# Patient Record
Sex: Male | Born: 1980 | Race: White | Hispanic: No | Marital: Single | Smoking: Never smoker
Health system: Southern US, Community
[De-identification: ages and names within clinical notes are randomized; demographics above are authoritative.]

## PROBLEM LIST (undated history)

## (undated) DIAGNOSIS — I10 Essential (primary) hypertension: Secondary | ICD-10-CM

## (undated) HISTORY — DX: Essential (primary) hypertension: I10

---
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
## 2002-03-22 ENCOUNTER — Emergency Department (HOSPITAL_COMMUNITY): Admission: EM | Admit: 2002-03-22 | Discharge: 2002-03-22 | Payer: Self-pay | Admitting: Emergency Medicine

## 2002-03-22 ENCOUNTER — Encounter: Payer: Self-pay | Admitting: Emergency Medicine

## 2005-09-16 ENCOUNTER — Ambulatory Visit (HOSPITAL_COMMUNITY): Admission: RE | Admit: 2005-09-16 | Discharge: 2005-09-16 | Payer: Self-pay | Admitting: Internal Medicine

## 2005-10-06 ENCOUNTER — Ambulatory Visit (HOSPITAL_COMMUNITY): Admission: RE | Admit: 2005-10-06 | Discharge: 2005-10-06 | Payer: Self-pay | Admitting: Orthopedic Surgery

## 2006-12-08 IMAGING — CR DG CHEST W/ LORDOTIC
3 series · 3 of 3 positions shown · non-contrast
Comparison: none

CLINICAL DATA: Right upper arm and axilla pain.  Some numbness as well.
 CHEST WITH LORDOTIC ? 3 VIEWS:
 PA and lateral chest with apical lordotic view dated 10/06/05:

[view not recorded (1 of 3)]
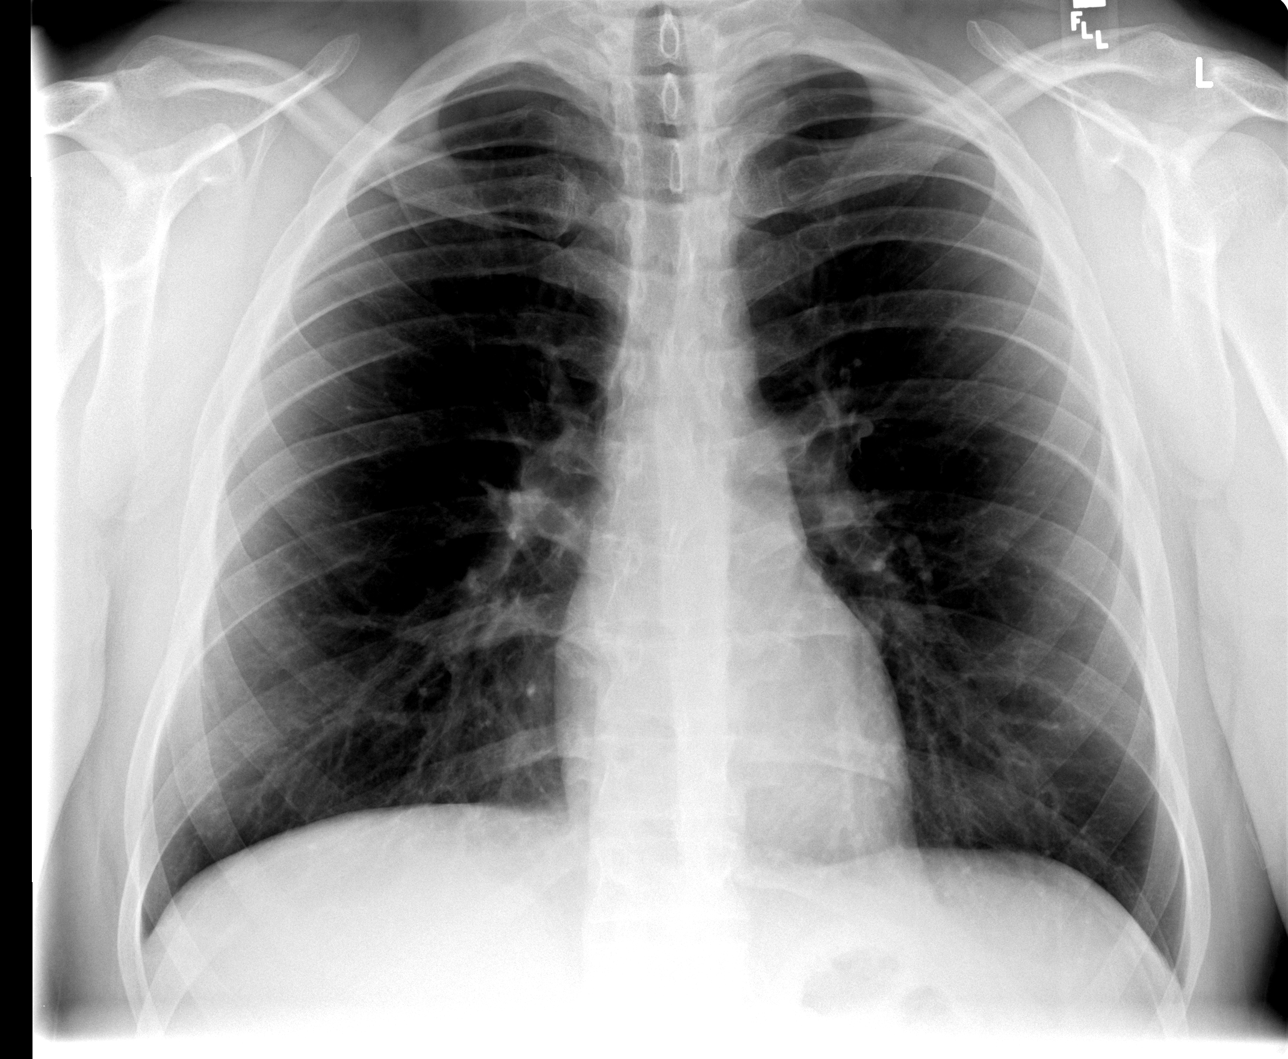

[view not recorded (2 of 3)]
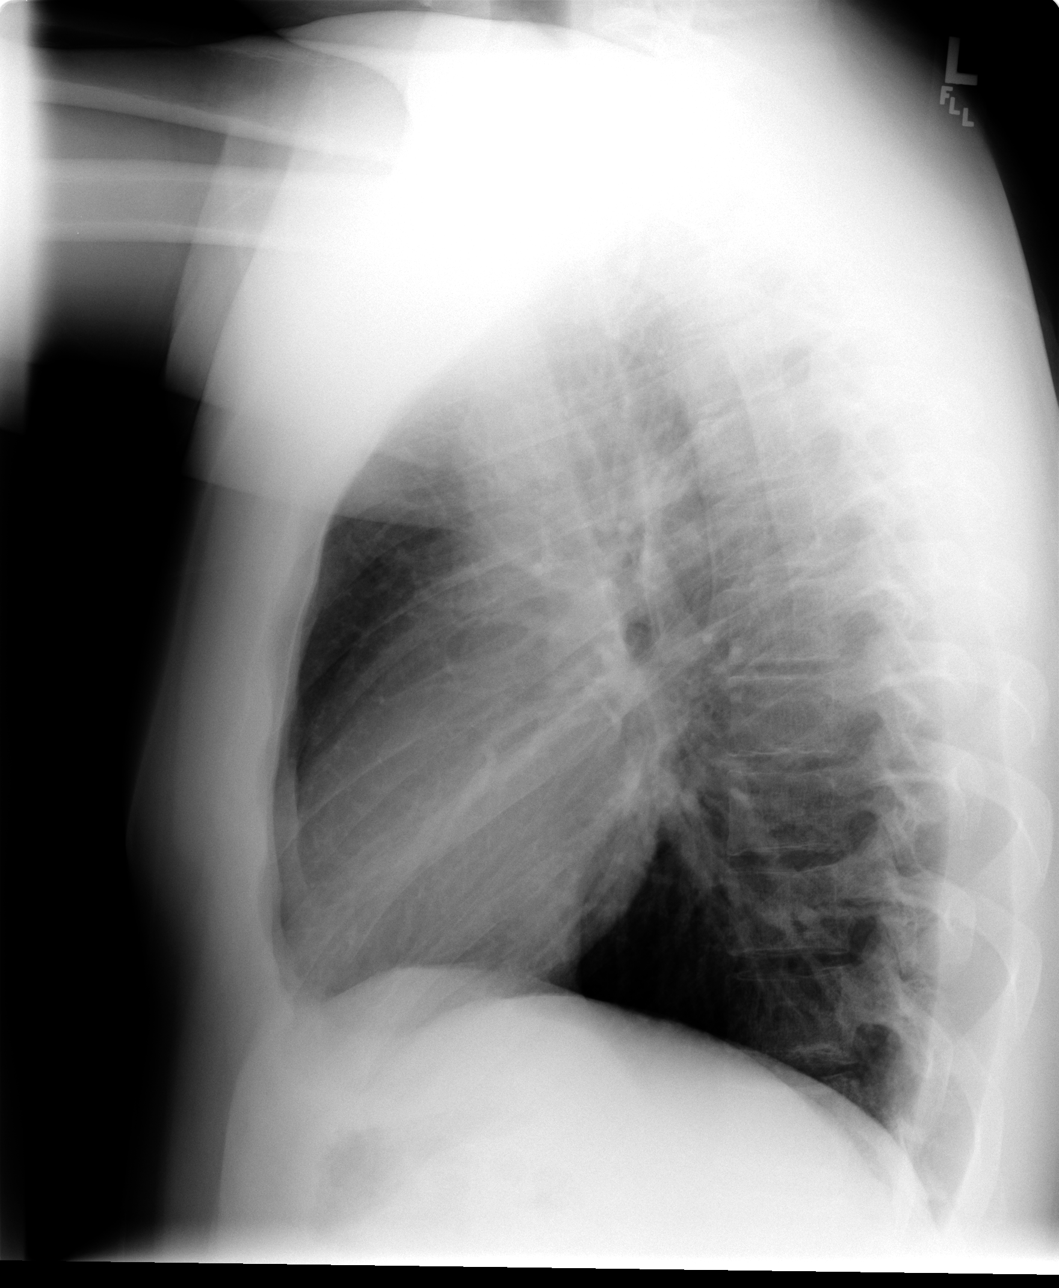

[view not recorded (3 of 3)]
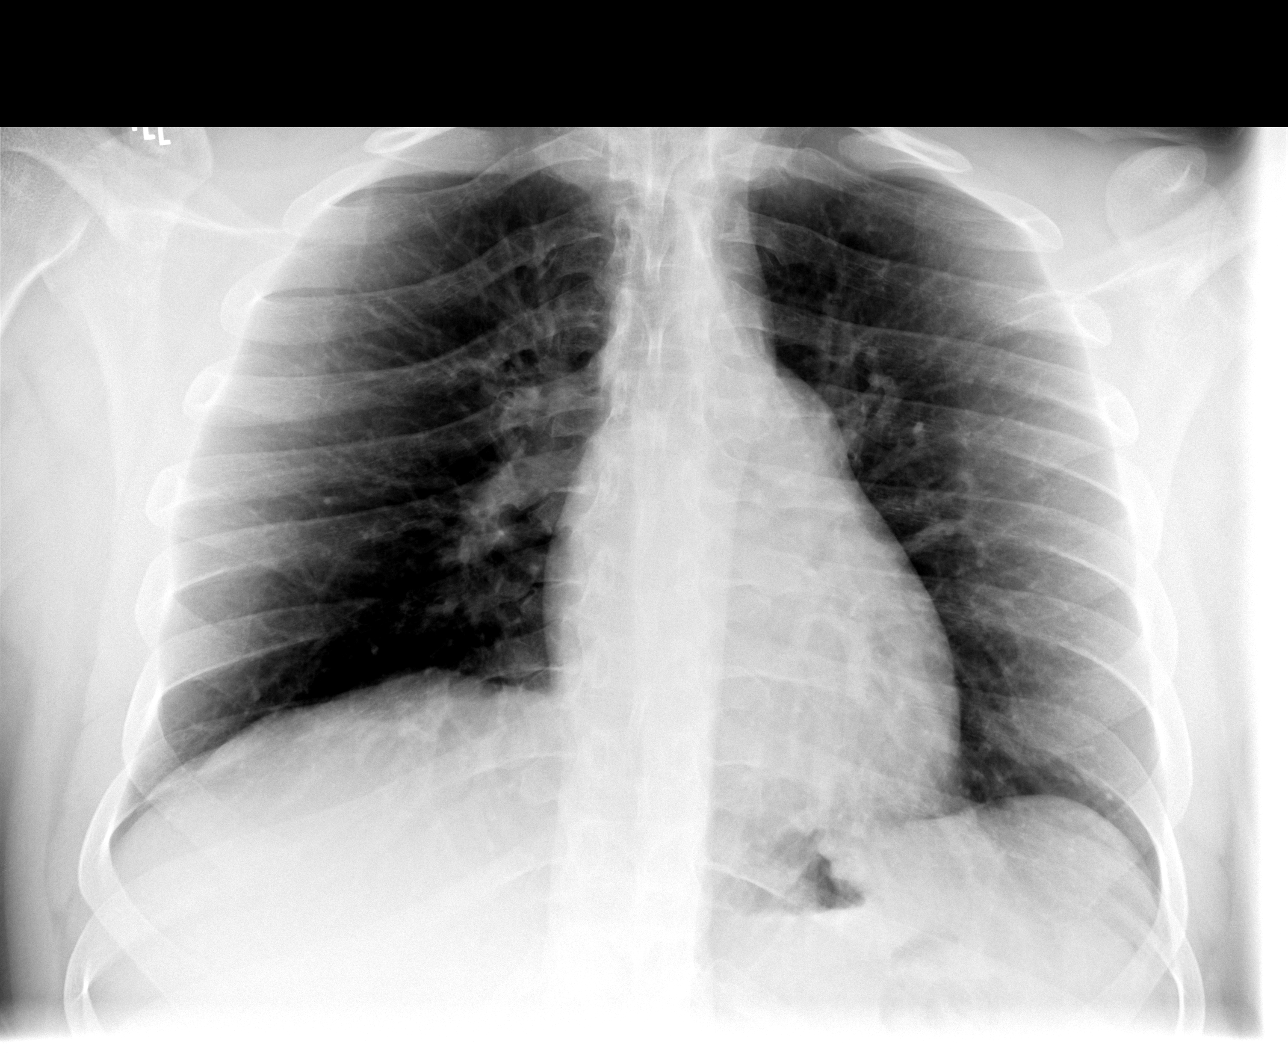

[3 of 3 positions shown; findings below may reference images not displayed]

FINDINGS: The heart size and vascularity are normal.  There is no evidence of an apical lung mass.  However, the patient does appear to have tiny cervical ribs bilaterally at C7 appearing to being slightly longer on the right than the left.  Could the cervical rib be responsible for the patient?s symptoms?
 No other abnormality.
IMPRESSION: Small bilateral cervical ribs, appearing to be slightly more prominent on the PA view on the right than the left.  
 Otherwise normal exam.

## 2008-08-01 ENCOUNTER — Emergency Department (HOSPITAL_COMMUNITY): Admission: EM | Admit: 2008-08-01 | Discharge: 2008-08-01 | Payer: Self-pay | Admitting: Emergency Medicine

## 2010-10-01 LAB — POCT CARDIAC MARKERS
CKMB, poc: <1 ng/mL — ABNORMAL LOW (ref 1.0–8.0)
Myoglobin, poc: 59.1 ng/mL (ref 12–200)
Troponin i, poc: <0.05 ng/mL (ref 0.00–0.09)

## 2012-10-24 ENCOUNTER — Emergency Department (HOSPITAL_COMMUNITY)
Admission: EM | Admit: 2012-10-24 | Discharge: 2012-10-24 | Disposition: A | Discharge status: Home / Self Care | Payer: BC Managed Care – PPO | Attending: Emergency Medicine | Admitting: Emergency Medicine

## 2012-10-24 ENCOUNTER — Encounter (HOSPITAL_COMMUNITY): Payer: Self-pay | Admitting: Emergency Medicine

## 2012-10-24 ENCOUNTER — Emergency Department (HOSPITAL_COMMUNITY): Payer: BC Managed Care – PPO

## 2012-10-24 DIAGNOSIS — T25129A Burn of first degree of unspecified foot, initial encounter: Secondary | ICD-10-CM | POA: Insufficient documentation

## 2012-10-24 DIAGNOSIS — X12XXXA Contact with other hot fluids, initial encounter: Secondary | ICD-10-CM | POA: Insufficient documentation

## 2012-10-24 DIAGNOSIS — T300 Burn of unspecified body region, unspecified degree: Secondary | ICD-10-CM

## 2012-10-24 DIAGNOSIS — Y929 Unspecified place or not applicable: Secondary | ICD-10-CM | POA: Insufficient documentation

## 2012-10-24 DIAGNOSIS — Z23 Encounter for immunization: Secondary | ICD-10-CM | POA: Insufficient documentation

## 2012-10-24 DIAGNOSIS — Y9389 Activity, other specified: Secondary | ICD-10-CM | POA: Insufficient documentation

## 2012-10-24 DIAGNOSIS — X131XXA Other contact with steam and other hot vapors, initial encounter: Secondary | ICD-10-CM | POA: Insufficient documentation

## 2012-10-24 MED ORDER — SULFAMETHOXAZOLE-TRIMETHOPRIM 800-160 MG PO TABS: 1.0000 | ORAL_TABLET | Freq: Two times a day (BID) | ORAL | Status: AC

## 2012-10-24 MED ORDER — SULFAMETHOXAZOLE-TRIMETHOPRIM 800-160 MG PO TABS: 1.0000 | ORAL_TABLET | Freq: Two times a day (BID) | ORAL | Status: DC

## 2012-10-24 MED ORDER — IBUPROFEN 800 MG PO TABS
800.0000 mg | ORAL_TABLET | Freq: Once | ORAL | Status: AC
  Administered 2012-10-24: 800 mg via ORAL
  Filled 2012-10-24: qty 1

## 2012-10-24 MED ORDER — SULFAMETHOXAZOLE-TMP DS 800-160 MG PO TABS
1.0000 | ORAL_TABLET | Freq: Once | ORAL | Status: AC
  Administered 2012-10-24: 1 via ORAL
  Filled 2012-10-24: qty 1

## 2012-10-24 MED ORDER — HYDROCODONE-ACETAMINOPHEN 5-325 MG PO TABS: 1.0000 | ORAL_TABLET | ORAL | Status: DC | PRN

## 2012-10-24 MED ORDER — TETANUS-DIPHTH-ACELL PERTUSSIS 5-2.5-18.5 LF-MCG/0.5 IM SUSP
0.5000 mL | Freq: Once | INTRAMUSCULAR | Status: AC
  Administered 2012-10-24: 0.5 mL via INTRAMUSCULAR
  Filled 2012-10-24: qty 0.5

## 2012-10-24 MED ORDER — BACITRACIN-NEOMYCIN-POLYMYXIN 400-5-5000 EX OINT
TOPICAL_OINTMENT | Freq: Once | CUTANEOUS | Status: AC
  Administered 2012-10-24: 1 via TOPICAL
  Filled 2012-10-24: qty 1
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
## 2012-10-24 NOTE — ED Notes (Signed)
Applied bacitracin to wound and covered with sterile dressing. Patient tolerated well.

## 2012-10-25 NOTE — ED Provider Notes (Signed)
Medical screening examination/treatment/procedure(s) were performed by non-physician practitioner and as supervising physician I was immediately available for consultation/collaboration.   Carleene Cooper III, MD 10/25/12 [phone]
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
## 2016-04-23 DIAGNOSIS — Z23 Encounter for immunization: Secondary | ICD-10-CM | POA: Diagnosis not present

## 2016-04-23 DIAGNOSIS — K1370 Unspecified lesions of oral mucosa: Secondary | ICD-10-CM | POA: Diagnosis not present

## 2016-05-01 DIAGNOSIS — E785 Hyperlipidemia, unspecified: Secondary | ICD-10-CM | POA: Diagnosis not present

## 2016-05-05 ENCOUNTER — Ambulatory Visit (INDEPENDENT_AMBULATORY_CARE_PROVIDER_SITE_OTHER): Payer: BLUE CROSS/BLUE SHIELD | Admitting: Otolaryngology

## 2016-05-05 DIAGNOSIS — D3709 Neoplasm of uncertain behavior of other specified sites of the oral cavity: Secondary | ICD-10-CM | POA: Diagnosis not present

## 2016-07-07 ENCOUNTER — Ambulatory Visit (INDEPENDENT_AMBULATORY_CARE_PROVIDER_SITE_OTHER): Payer: BLUE CROSS/BLUE SHIELD | Admitting: Otolaryngology

## 2016-07-07 DIAGNOSIS — D3705 Neoplasm of uncertain behavior of pharynx: Secondary | ICD-10-CM | POA: Diagnosis not present

## 2017-01-05 ENCOUNTER — Ambulatory Visit (INDEPENDENT_AMBULATORY_CARE_PROVIDER_SITE_OTHER): Payer: BLUE CROSS/BLUE SHIELD | Admitting: Otolaryngology

## 2017-01-05 DIAGNOSIS — D3702 Neoplasm of uncertain behavior of tongue: Secondary | ICD-10-CM

## 2017-07-23 DIAGNOSIS — M25561 Pain in right knee: Secondary | ICD-10-CM | POA: Diagnosis not present

## 2017-08-12 DIAGNOSIS — Z0000 Encounter for general adult medical examination without abnormal findings: Secondary | ICD-10-CM | POA: Diagnosis not present

## 2017-08-14 DIAGNOSIS — Z0000 Encounter for general adult medical examination without abnormal findings: Secondary | ICD-10-CM | POA: Diagnosis not present

## 2018-05-12 DIAGNOSIS — R0602 Shortness of breath: Secondary | ICD-10-CM | POA: Diagnosis not present

## 2018-05-12 DIAGNOSIS — I10 Essential (primary) hypertension: Secondary | ICD-10-CM | POA: Diagnosis not present

## 2018-05-26 DIAGNOSIS — I10 Essential (primary) hypertension: Secondary | ICD-10-CM | POA: Diagnosis not present

## 2018-05-26 DIAGNOSIS — R0602 Shortness of breath: Secondary | ICD-10-CM | POA: Diagnosis not present

## 2018-12-03 DIAGNOSIS — T63441A Toxic effect of venom of bees, accidental (unintentional), initial encounter: Secondary | ICD-10-CM | POA: Diagnosis not present

## 2018-12-03 DIAGNOSIS — T7840XA Allergy, unspecified, initial encounter: Secondary | ICD-10-CM | POA: Diagnosis not present

## 2018-12-08 DIAGNOSIS — T63441A Toxic effect of venom of bees, accidental (unintentional), initial encounter: Secondary | ICD-10-CM | POA: Diagnosis not present

## 2019-01-04 DIAGNOSIS — T63441D Toxic effect of venom of bees, accidental (unintentional), subsequent encounter: Secondary | ICD-10-CM | POA: Diagnosis not present

## 2019-01-12 DIAGNOSIS — T63441D Toxic effect of venom of bees, accidental (unintentional), subsequent encounter: Secondary | ICD-10-CM | POA: Diagnosis not present

## 2019-01-20 DIAGNOSIS — T63441D Toxic effect of venom of bees, accidental (unintentional), subsequent encounter: Secondary | ICD-10-CM | POA: Diagnosis not present

## 2019-01-26 DIAGNOSIS — T63441D Toxic effect of venom of bees, accidental (unintentional), subsequent encounter: Secondary | ICD-10-CM | POA: Diagnosis not present

## 2019-02-04 DIAGNOSIS — T63441D Toxic effect of venom of bees, accidental (unintentional), subsequent encounter: Secondary | ICD-10-CM | POA: Diagnosis not present

## 2019-02-11 DIAGNOSIS — T63441D Toxic effect of venom of bees, accidental (unintentional), subsequent encounter: Secondary | ICD-10-CM | POA: Diagnosis not present

## 2019-02-18 DIAGNOSIS — T63441D Toxic effect of venom of bees, accidental (unintentional), subsequent encounter: Secondary | ICD-10-CM | POA: Diagnosis not present

## 2019-02-25 DIAGNOSIS — T63441D Toxic effect of venom of bees, accidental (unintentional), subsequent encounter: Secondary | ICD-10-CM | POA: Diagnosis not present

## 2019-03-04 DIAGNOSIS — T63441D Toxic effect of venom of bees, accidental (unintentional), subsequent encounter: Secondary | ICD-10-CM | POA: Diagnosis not present

## 2019-03-11 DIAGNOSIS — Z91030 Bee allergy status: Secondary | ICD-10-CM | POA: Diagnosis not present

## 2019-03-18 DIAGNOSIS — Z91030 Bee allergy status: Secondary | ICD-10-CM | POA: Diagnosis not present

## 2019-03-25 DIAGNOSIS — Z91030 Bee allergy status: Secondary | ICD-10-CM | POA: Diagnosis not present

## 2019-04-01 DIAGNOSIS — Z91030 Bee allergy status: Secondary | ICD-10-CM | POA: Diagnosis not present

## 2019-04-08 DIAGNOSIS — Z91030 Bee allergy status: Secondary | ICD-10-CM | POA: Diagnosis not present

## 2019-04-15 DIAGNOSIS — Z91030 Bee allergy status: Secondary | ICD-10-CM | POA: Diagnosis not present

## 2019-04-22 DIAGNOSIS — Z91030 Bee allergy status: Secondary | ICD-10-CM | POA: Diagnosis not present

## 2019-05-06 DIAGNOSIS — Z91030 Bee allergy status: Secondary | ICD-10-CM | POA: Diagnosis not present

## 2019-05-11 DIAGNOSIS — Z91030 Bee allergy status: Secondary | ICD-10-CM | POA: Diagnosis not present

## 2019-05-20 DIAGNOSIS — Z91030 Bee allergy status: Secondary | ICD-10-CM | POA: Diagnosis not present

## 2019-05-27 DIAGNOSIS — Z91030 Bee allergy status: Secondary | ICD-10-CM | POA: Diagnosis not present

## 2019-06-03 DIAGNOSIS — Z91030 Bee allergy status: Secondary | ICD-10-CM | POA: Diagnosis not present

## 2019-06-14 DIAGNOSIS — Z91030 Bee allergy status: Secondary | ICD-10-CM | POA: Diagnosis not present

## 2019-08-24 DIAGNOSIS — M545 Low back pain: Secondary | ICD-10-CM | POA: Diagnosis not present

## 2019-11-25 DIAGNOSIS — R52 Pain, unspecified: Secondary | ICD-10-CM | POA: Diagnosis not present

## 2019-11-25 DIAGNOSIS — W57XXXA Bitten or stung by nonvenomous insect and other nonvenomous arthropods, initial encounter: Secondary | ICD-10-CM | POA: Diagnosis not present

## 2020-07-27 DIAGNOSIS — Z23 Encounter for immunization: Secondary | ICD-10-CM | POA: Diagnosis not present

## 2020-07-27 DIAGNOSIS — W540XXA Bitten by dog, initial encounter: Secondary | ICD-10-CM | POA: Diagnosis not present

## 2020-07-27 DIAGNOSIS — S41151A Open bite of right upper arm, initial encounter: Secondary | ICD-10-CM | POA: Diagnosis not present

## 2020-07-27 DIAGNOSIS — L03113 Cellulitis of right upper limb: Secondary | ICD-10-CM | POA: Diagnosis not present

## 2020-09-13 DIAGNOSIS — L570 Actinic keratosis: Secondary | ICD-10-CM | POA: Diagnosis not present

## 2020-09-13 DIAGNOSIS — L578 Other skin changes due to chronic exposure to nonionizing radiation: Secondary | ICD-10-CM | POA: Diagnosis not present

## 2021-09-19 ENCOUNTER — Ambulatory Visit: Payer: BC Managed Care – PPO | Admitting: Cardiology

## 2021-09-19 ENCOUNTER — Other Ambulatory Visit (HOSPITAL_COMMUNITY)
Admission: RE | Admit: 2021-09-19 | Discharge: 2021-09-19 | Disposition: A | Discharge status: Home / Self Care | Payer: BC Managed Care – PPO | Source: Ambulatory Visit | Attending: Cardiology | Admitting: Cardiology

## 2021-09-19 ENCOUNTER — Encounter: Payer: Self-pay | Admitting: Cardiology

## 2021-09-19 VITALS — BP 120/74 | HR 95 | Ht 74.0 in | Wt 236.0 lb

## 2021-09-19 DIAGNOSIS — R42 Dizziness and giddiness: Secondary | ICD-10-CM | POA: Diagnosis not present

## 2021-09-19 DIAGNOSIS — I10 Essential (primary) hypertension: Secondary | ICD-10-CM | POA: Insufficient documentation

## 2021-09-19 DIAGNOSIS — R072 Precordial pain: Secondary | ICD-10-CM | POA: Diagnosis not present

## 2021-09-19 DIAGNOSIS — R079 Chest pain, unspecified: Secondary | ICD-10-CM

## 2021-09-19 DIAGNOSIS — H42 Glaucoma in diseases classified elsewhere: Secondary | ICD-10-CM | POA: Diagnosis present

## 2021-09-19 LAB — BASIC METABOLIC PANEL
Anion gap: 8 (ref 5–15)
BUN: 17 mg/dL (ref 6–20)
CO2: 28 mmol/L (ref 22–32)
Calcium: 9.2 mg/dL (ref 8.9–10.3)
Chloride: 105 mmol/L (ref 98–111)
Creatinine, Ser: 0.95 mg/dL (ref 0.61–1.24)
GFR, Estimated: >60 mL/min (ref >60)
Glucose, Bld: 104 mg/dL — ABNORMAL HIGH (ref 70–99)
Potassium: 3.8 mmol/L (ref 3.5–5.1)
Sodium: 141 mmol/L (ref 135–145)

## 2021-09-19 MED ORDER — METOPROLOL TARTRATE 100 MG PO TABS: 100.0000 mg | ORAL_TABLET | Freq: Once | ORAL | 0 refills | Status: DC

## 2021-09-19 NOTE — Addendum Note (Signed)
Addended by: Antonieta Iba on: 09/19/2021 09:36 AM ? ? Modules accepted: Orders ? ?

## 2021-09-19 NOTE — Addendum Note (Signed)
Addended by: Antonieta Iba on: 09/19/2021 10:57 AM ? ? Modules accepted: Orders ? ?

## 2021-09-19 NOTE — Patient Instructions (Addendum)
Medication Instructions:  ?Your physician recommends that you continue on your current medications as directed. Please refer to the Current Medication list given to you today. ? ?*If you need a refill on your cardiac medications before your next appointment, please call your pharmacy* ? ? ?Lab Work: ?TODAY: BMET ?If you have labs (blood work) drawn today and your tests are completely normal, you will receive your results only by: ?MyChart Message (if you have MyChart) OR ?A paper copy in the mail ?If you have any lab test that is abnormal or we need to change your treatment, we will call you to review the results. ? ? ?Testing/Procedures: ?Your physician has requested that you have an echocardiogram. Echocardiography is a painless test that uses sound waves to create images of your heart. It provides your doctor with information about the size and shape of your heart and how well your heart?s chambers and valves are working. This procedure takes approximately one hour. There are no restrictions for this procedure. ? ?Your physician has requested that you have coronary CTA scan. Please see below for further instructions.  ? ?Follow-Up: ?At [facility], you and your health needs are our priority.  As part of our continuing mission to provide you with exceptional heart care, we have created designated Provider Care Teams.  These Care Teams include your primary Cardiologist (physician) and Advanced Practice Providers (APPs -  Physician Assistants and Nurse Practitioners) who all work together to provide you with the care you need, when you need it. ? ?Follow up with Dr. Radford Pax as needed based on results of testing.  ? ? ?Other Instructions ? ? ?Your cardiac CT will be scheduled at one of the below locations:  ? ?[facility] ?[address] ?[place], [place] [address] ?[phone] ? ?If scheduled at [facility], please arrive at the [facility] and Children's Entrance (Entrance C2) of [facility] 30 minutes prior to test start time. ?You can use the FREE valet parking offered at entrance C (encouraged to control the heart rate for the test)  ?Proceed to the [facility] Radiology Department (first floor) to check-in and test prep. ? ?All radiology patients and guests should use entrance C2 at [facility], accessed from [facility], even though the hospital's physical address listed is [address]. ? ? ? ? ?Please follow these instructions carefully (unless otherwise directed): ? ?Hold all erectile dysfunction medications at least 3 days (72 hrs) prior to test. ? ?On the Night Before the Test: ?Be sure to Drink plenty of water. ?Do not consume any caffeinated/decaffeinated beverages or chocolate 12 hours prior to your test. ?Do not take any antihistamines 12 hours prior to your test. ? ?On the Day of the Test: ?Drink plenty of water until 1 hour prior to the test. ?Do not eat any food 4 hours prior to the test. ?You may take your regular medications prior to the test.  ?Take metoprolol (Lopressor) two hours prior to test. ?HOLD Hydrochlorothiazide morning of the test. ? ?After the Test: ?Drink plenty of water. ?After receiving IV contrast, you may experience a mild flushed feeling. This is normal. ?On occasion, you may experience a mild rash up to 24 hours after the test. This is not dangerous. If this occurs, you can take Benadryl 25 mg and increase your fluid intake. ?If you experience trouble breathing, this can be serious. If it is severe call 911 IMMEDIATELY. If it is mild, please call our  office. ?If you take any of these medications: Glipizide/Metformin, Avandament, Glucavance, please do not take 48 hours after completing test unless otherwise instructed. ? ?We will call to schedule your test 2-4 weeks out understanding that some insurance companies will need an authorization prior to the service being performed.  ? ?For non-scheduling related questions, please  contact the cardiac imaging nurse navigator should you have any questions/concerns: ?Marchia Bond, Cardiac Imaging Nurse Navigator ?Gordy Clement, Cardiac Imaging Nurse Navigator ?[facility] ?Direct Office Dial: [phone]  ? ?For scheduling needs, including cancellations and rescheduling, please call [place], [phone].   ?

## 2021-09-19 NOTE — Progress Notes (Signed)
? ?Cardiology CONSULT Note   ? ?Date:  09/19/2021  ? ?ID:  Raymond James, DOB [birth year], MRN [id number] ? ?PCP:  Asencion Noble, MD  ?Cardiologist:  Fransico Him, MD  ? ?Chief Complaint  ?Patient presents with  ? New Patient (Initial Visit)  ?  Chest pain, HTN, dizziness  ? ? ?History of Present Illness:  ?Raymond James is a 41 y.o. male who is being seen today for the evaluation of chest pain at the request of Pablo Lawrence, NP. ? ?This is a 41yo male with no prior medical hx who recently was [facility] the [facility] for a filling.  He had it numbed and the next day he woke up and was very dizzy and felt very off balance with the room. He has a hard time describing it.  He tells me that also when he is on the ground playing with his kids and then stands up he gets very swimmy headed and feels like he is going to pass out.   ? ?He has had some problems with his BP being high and his PCP started Losartan. He then started having swimmy headedness especially with changes in position.  He had a URI 6 weeks ago and his symptoms got worse.  He has had an ear ache after the URI.  He was dx with external and internal ear infection and has an appt with ENT.He thinks the dizziness has gotten better after starting Bp med.   ? ?He tells me that he has been having episodes of chest pressure recently with no radiation.  There are no associated sx of nausea, diaphoresis or SOB.  It is nonexertional.  He says that it makes him feel anxious.  He has a lot of problems with stress right now and his wife encouraged him to see Cardiology.  He has a fm hx of HTN but no fm hx of CAD.  He has never smoked. ? ?Past Medical History:  ?Diagnosis Date  ? Benign essential HTN   ? ? ?No past surgical history on file. ? ?Current Medications: ?Current Meds  ?Medication Sig  ? losartan-hydrochlorothiazide (HYZAAR) 50-12.5 MG tablet Take 1 tablet by mouth daily.  ? ? ?Allergies:   Patient has no known allergies.  ? ?Social History   ? ?Socioeconomic History  ? Marital status: Married  ?  Spouse name: Not on file  ? Number of children: Not on file  ? Years of education: Not on file  ? Highest education level: Not on file  ?Occupational History  ? Not on file  ?Tobacco Use  ? Smoking status: Never  ? Smokeless tobacco: Current  ?  Types: Snuff  ?Substance and Sexual Activity  ? Alcohol use: No  ? Drug use: No  ? Sexual activity: Not on file  ?Other Topics Concern  ? Not on file  ?Social History Narrative  ? ** Merged History Encounter **  ?    ? ?Social Determinants of Health  ? ?Financial Resource Strain: Not on file  ?Food Insecurity: Not on file  ?Transportation Needs: Not on file  ?Physical Activity: Not on file  ?Stress: Not on file  ?Social Connections: Not on file  ?  ? ?Family History:  The patient's family history includes Diabetes in his father; Hyperlipidemia in his father and mother.  ? ?ROS:   ?Please see the history of present illness.    ?ROS All other systems reviewed and are negative. ? ?   ? View :  No data to display.  ?  ?  ?  ? ? ? ? ? ?PHYSICAL EXAM:   ?VS:  BP 120/74   Pulse 95   Ht '6\' 2"'$  (1.88 m)   Wt 236 lb (107 kg)   SpO2 95%   BMI 30.30 kg/m?    ?GEN: Well nourished, well developed, in no acute distress  ?HEENT: normal  ?Neck: no JVD, carotid bruits, or masses ?Cardiac: RRR; no murmurs, rubs, or gallops,no edema.  Intact distal pulses bilaterally.  ?Respiratory:  clear to auscultation bilaterally, normal work of breathing ?GI: soft, nontender, nondistended, + BS ?MS: no deformity or atrophy  ?Skin: warm and dry, no rash ?Neuro:  Alert and Oriented x 3, Strength and sensation are intact ?Psych: euthymic mood, full affect ? ?Wt Readings from Last 3 Encounters:  ?09/19/21 236 lb (107 kg)  ?10/24/12 245 lb (111.1 kg)  ?  ? ? ?Studies/Labs Reviewed:  ? ?EKG:  EKG is ordered today.  The ekg ordered today demonstrates NSR with nonspecific ST abnormality ? ?Recent Labs: ?No results found for requested labs within last 8760  hours.  ? ?Lipid Panel ?No results found for: CHOL, TRIG, HDL, CHOLHDL, VLDL, LDLCALC, LDLDIRECT ? ?Additional studies/ records that were reviewed today include:  ?Office visit notes from PCP ? ? ? ?ASSESSMENT:   ? ?1. Chest pain of uncertain etiology   ?2. Benign essential HTN   ?3. Dizziness   ? ? ? ?PLAN:  ?In order of problems listed above: ? ?Chest pain ?-this is described as a chest pressure/heaviness ?-he has a lot of stress and anxiety ?-he has no fm hx of CAD  ?-CRFs mainly include his HTN, Obesity and stress ?-I will get a coronary CTA to define coronary anatomy and rule out anomalous coronary artery ?-check 2D echo to rule out pericardial effusion ?I have personally reviewed and interpreted outside labs performed by patient's PCP which showed LDL 108, HDL 33, TAG  ? ?2.  HTN ?-BP well controlled on exam today ?-continue prescription drug management with Losartan-HCT 50-12.'5mg'$  daily with PRN refills ? ?3.  Dizziness ?-suspect some of this may be vertigo based on recent URI and ear issues ?-I also suspect there is a component of orthostatic hypotension>>he works outside and sx seems to happen after bending over and standing up ?-encouraged him to drink at least 64oz of fluids daily ? ?Time Spent: ?20 minutes total time of encounter, including 15 minutes spent in face-to-face patient care on the date of this encounter. This time includes coordination of care and counseling regarding above mentioned problem list. Remainder of non-face-to-face time involved reviewing chart documents/testing relevant to the patient encounter and documentation in the medical record. I have independently reviewed documentation from referring provider ? ?Medication Adjustments/Labs and Tests Ordered: ?Current medicines are reviewed at length with the patient today.  Concerns regarding medicines are outlined above.  Medication changes, Labs and Tests ordered today are listed in the Patient Instructions below. ? ?There are no  Patient Instructions on file for this visit. ? ? ?Signed, ?Fransico Him, MD  ?09/19/2021 9:29 AM    ?[facility] ?[place], [place], [place]  [address] ?Phone: [phone]; Fax: [phone]  ? ?

## 2021-09-30 ENCOUNTER — Telehealth (HOSPITAL_COMMUNITY): Payer: Self-pay | Admitting: Emergency Medicine

## 2021-09-30 NOTE — Telephone Encounter (Signed)
Attempted to call patient regarding upcoming cardiac CT appointment. °Left message on voicemail with name and callback number °Raymond Flavell RN Navigator Cardiac Imaging °[facility] °[phone] Office °[phone] Cell ° °

## 2021-10-02 ENCOUNTER — Ambulatory Visit (HOSPITAL_COMMUNITY): Payer: BC Managed Care – PPO

## 2021-10-07 ENCOUNTER — Ambulatory Visit (HOSPITAL_COMMUNITY)
Admission: RE | Admit: 2021-10-07 | Discharge: 2021-10-07 | Disposition: A | Discharge status: Home / Self Care | Payer: BC Managed Care – PPO | Source: Ambulatory Visit | Attending: Cardiology | Admitting: Cardiology

## 2021-10-07 ENCOUNTER — Telehealth: Payer: Self-pay

## 2021-10-07 DIAGNOSIS — R072 Precordial pain: Secondary | ICD-10-CM | POA: Insufficient documentation

## 2021-10-07 DIAGNOSIS — R42 Dizziness and giddiness: Secondary | ICD-10-CM | POA: Diagnosis present

## 2021-10-07 DIAGNOSIS — R079 Chest pain, unspecified: Secondary | ICD-10-CM | POA: Insufficient documentation

## 2021-10-07 DIAGNOSIS — I10 Essential (primary) hypertension: Secondary | ICD-10-CM | POA: Diagnosis present

## 2021-10-07 LAB — ECHOCARDIOGRAM COMPLETE
Area-P 1/2: 5.97 cm2
S' Lateral: 2.5 cm

## 2021-10-07 NOTE — Progress Notes (Signed)
*  PRELIMINARY RESULTS* ?Echocardiogram ?2D Echocardiogram has been performed. ? ?Raymond James ?10/07/2021, 9:17 AM ?

## 2021-10-07 NOTE — Telephone Encounter (Signed)
Patient notified and verbalized understanding. Pt had no questions or concerns at this time 

## 2021-10-15 ENCOUNTER — Other Ambulatory Visit (HOSPITAL_COMMUNITY): Payer: Self-pay | Admitting: Emergency Medicine

## 2021-10-15 ENCOUNTER — Telehealth (HOSPITAL_COMMUNITY): Payer: Self-pay | Admitting: Emergency Medicine

## 2021-10-15 DIAGNOSIS — R931 Abnormal findings on diagnostic imaging of heart and coronary circulation: Secondary | ICD-10-CM

## 2021-10-15 DIAGNOSIS — R079 Chest pain, unspecified: Secondary | ICD-10-CM

## 2021-10-15 MED ORDER — METOPROLOL TARTRATE 100 MG PO TABS: 100.0000 mg | ORAL_TABLET | Freq: Once | ORAL | 0 refills | Status: AC

## 2021-10-15 NOTE — Telephone Encounter (Signed)
Reaching out to patient to offer assistance regarding upcoming cardiac imaging study; pt verbalizes understanding of appt date/time, parking situation and where to check in, pre-test NPO status and medications ordered, and verified current allergies; name and call back number provided for further questions should they arise ?Marchia Bond RN Navigator Cardiac Imaging ?[facility] ?[phone] office ?[phone] cell ? ? ?'100mg'$  metoprolol  ?Arrival 730 ?Denies iv issues ?

## 2021-10-16 ENCOUNTER — Ambulatory Visit (HOSPITAL_COMMUNITY)
Admission: RE | Admit: 2021-10-16 | Discharge: 2021-10-16 | Disposition: A | Discharge status: Home / Self Care | Payer: BC Managed Care – PPO | Source: Ambulatory Visit | Attending: Cardiology | Admitting: Cardiology

## 2021-10-16 DIAGNOSIS — R072 Precordial pain: Secondary | ICD-10-CM | POA: Insufficient documentation

## 2021-10-16 MED ORDER — NITROGLYCERIN 0.4 MG SL SUBL
0.8000 mg | SUBLINGUAL_TABLET | Freq: Once | SUBLINGUAL | Status: AC
  Administered 2021-10-16: 0.8 mg via SUBLINGUAL

## 2021-10-16 MED ORDER — NITROGLYCERIN 0.4 MG SL SUBL
SUBLINGUAL_TABLET | SUBLINGUAL | Status: AC
Start: 2021-10-16 — End: 2021-10-16
  Filled 2021-10-16: qty 2

## 2021-10-16 MED ORDER — IOHEXOL 350 MG/ML SOLN
100.0000 mL | Freq: Once | INTRAVENOUS | Status: AC | PRN
  Administered 2021-10-16: 100 mL via INTRAVENOUS
